# Patient Record
Sex: Female | Born: 1960 | Race: Black or African American | Hispanic: No | Marital: Single | Smoking: Never smoker
Health system: Southern US, Community
[De-identification: ages and names within clinical notes are randomized; demographics above are authoritative.]

---

## 2015-04-12 ENCOUNTER — Other Ambulatory Visit (HOSPITAL_COMMUNITY)
Admission: RE | Admit: 2015-04-12 | Discharge: 2015-04-12 | Disposition: A | Discharge status: Home / Self Care | Payer: Self-pay | Source: Ambulatory Visit | Attending: Family Medicine | Admitting: Family Medicine

## 2015-04-12 DIAGNOSIS — Z1151 Encounter for screening for human papillomavirus (HPV): Secondary | ICD-10-CM | POA: Insufficient documentation

## 2015-04-12 DIAGNOSIS — Z01411 Encounter for gynecological examination (general) (routine) with abnormal findings: Secondary | ICD-10-CM | POA: Insufficient documentation

## 2015-11-05 ENCOUNTER — Other Ambulatory Visit: Payer: Self-pay

## 2015-11-05 DIAGNOSIS — Z1231 Encounter for screening mammogram for malignant neoplasm of breast: Secondary | ICD-10-CM

## 2015-11-27 ENCOUNTER — Ambulatory Visit
Admission: RE | Admit: 2015-11-27 | Discharge: 2015-11-27 | Disposition: A | Discharge status: Home / Self Care | Payer: 59 | Source: Ambulatory Visit

## 2015-11-27 DIAGNOSIS — Z1231 Encounter for screening mammogram for malignant neoplasm of breast: Secondary | ICD-10-CM

## 2016-05-02 ENCOUNTER — Other Ambulatory Visit: Payer: Self-pay | Admitting: Family Medicine

## 2016-05-02 ENCOUNTER — Other Ambulatory Visit (HOSPITAL_COMMUNITY)
Admission: RE | Admit: 2016-05-02 | Discharge: 2016-05-02 | Disposition: A | Discharge status: Home / Self Care | Payer: 59 | Source: Ambulatory Visit | Attending: Family Medicine | Admitting: Family Medicine

## 2016-05-02 DIAGNOSIS — Z1151 Encounter for screening for human papillomavirus (HPV): Secondary | ICD-10-CM | POA: Insufficient documentation

## 2016-05-06 LAB — CYTOLOGY - PAP

## 2016-12-05 ENCOUNTER — Other Ambulatory Visit: Payer: Self-pay | Admitting: Family Medicine

## 2016-12-05 ENCOUNTER — Ambulatory Visit
Admission: RE | Admit: 2016-12-05 | Discharge: 2016-12-05 | Disposition: A | Discharge status: Home / Self Care | Payer: 59 | Source: Ambulatory Visit | Attending: Family Medicine | Admitting: Family Medicine

## 2016-12-05 DIAGNOSIS — Z1231 Encounter for screening mammogram for malignant neoplasm of breast: Secondary | ICD-10-CM | POA: Diagnosis not present

## 2017-01-20 DIAGNOSIS — L219 Seborrheic dermatitis, unspecified: Secondary | ICD-10-CM | POA: Diagnosis not present

## 2017-01-20 DIAGNOSIS — L309 Dermatitis, unspecified: Secondary | ICD-10-CM | POA: Diagnosis not present

## 2017-01-20 DIAGNOSIS — L400 Psoriasis vulgaris: Secondary | ICD-10-CM | POA: Diagnosis not present

## 2017-04-23 DIAGNOSIS — M2170 Unequal limb length (acquired), unspecified site: Secondary | ICD-10-CM | POA: Diagnosis not present

## 2017-04-23 DIAGNOSIS — M76821 Posterior tibial tendinitis, right leg: Secondary | ICD-10-CM | POA: Diagnosis not present

## 2017-04-23 DIAGNOSIS — M76822 Posterior tibial tendinitis, left leg: Secondary | ICD-10-CM | POA: Diagnosis not present

## 2017-04-30 DIAGNOSIS — M71572 Other bursitis, not elsewhere classified, left ankle and foot: Secondary | ICD-10-CM | POA: Diagnosis not present

## 2017-04-30 DIAGNOSIS — M722 Plantar fascial fibromatosis: Secondary | ICD-10-CM | POA: Diagnosis not present

## 2017-08-03 DIAGNOSIS — M722 Plantar fascial fibromatosis: Secondary | ICD-10-CM | POA: Diagnosis not present

## 2017-08-03 DIAGNOSIS — M71571 Other bursitis, not elsewhere classified, right ankle and foot: Secondary | ICD-10-CM | POA: Diagnosis not present

## 2017-09-18 DIAGNOSIS — M71571 Other bursitis, not elsewhere classified, right ankle and foot: Secondary | ICD-10-CM | POA: Diagnosis not present

## 2017-09-18 DIAGNOSIS — M722 Plantar fascial fibromatosis: Secondary | ICD-10-CM | POA: Diagnosis not present

## 2017-09-28 DIAGNOSIS — L309 Dermatitis, unspecified: Secondary | ICD-10-CM | POA: Diagnosis not present

## 2017-09-28 DIAGNOSIS — L409 Psoriasis, unspecified: Secondary | ICD-10-CM | POA: Diagnosis not present

## 2017-10-05 DIAGNOSIS — L408 Other psoriasis: Secondary | ICD-10-CM | POA: Diagnosis not present

## 2018-02-02 DIAGNOSIS — M25532 Pain in left wrist: Secondary | ICD-10-CM | POA: Diagnosis not present

## 2018-02-02 DIAGNOSIS — M25531 Pain in right wrist: Secondary | ICD-10-CM | POA: Diagnosis not present

## 2018-02-15 DIAGNOSIS — R200 Anesthesia of skin: Secondary | ICD-10-CM | POA: Diagnosis not present

## 2018-03-03 DIAGNOSIS — G5601 Carpal tunnel syndrome, right upper limb: Secondary | ICD-10-CM | POA: Diagnosis not present

## 2018-03-03 DIAGNOSIS — G5602 Carpal tunnel syndrome, left upper limb: Secondary | ICD-10-CM | POA: Diagnosis not present

## 2018-03-15 DIAGNOSIS — G5601 Carpal tunnel syndrome, right upper limb: Secondary | ICD-10-CM | POA: Diagnosis not present

## 2018-03-15 DIAGNOSIS — G5602 Carpal tunnel syndrome, left upper limb: Secondary | ICD-10-CM | POA: Diagnosis not present

## 2018-03-30 DIAGNOSIS — G5602 Carpal tunnel syndrome, left upper limb: Secondary | ICD-10-CM | POA: Diagnosis not present

## 2018-04-02 DIAGNOSIS — G5602 Carpal tunnel syndrome, left upper limb: Secondary | ICD-10-CM | POA: Diagnosis not present

## 2018-04-13 DIAGNOSIS — G5602 Carpal tunnel syndrome, left upper limb: Secondary | ICD-10-CM | POA: Diagnosis not present

## 2018-04-14 DIAGNOSIS — G5602 Carpal tunnel syndrome, left upper limb: Secondary | ICD-10-CM | POA: Diagnosis not present

## 2018-04-14 DIAGNOSIS — M65341 Trigger finger, right ring finger: Secondary | ICD-10-CM | POA: Diagnosis not present

## 2018-05-26 DIAGNOSIS — M65341 Trigger finger, right ring finger: Secondary | ICD-10-CM | POA: Diagnosis not present

## 2018-05-26 DIAGNOSIS — G5602 Carpal tunnel syndrome, left upper limb: Secondary | ICD-10-CM | POA: Diagnosis not present

## 2018-05-26 DIAGNOSIS — G5601 Carpal tunnel syndrome, right upper limb: Secondary | ICD-10-CM | POA: Diagnosis not present

## 2018-05-27 DIAGNOSIS — S93601A Unspecified sprain of right foot, initial encounter: Secondary | ICD-10-CM | POA: Diagnosis not present

## 2018-05-27 DIAGNOSIS — M79671 Pain in right foot: Secondary | ICD-10-CM | POA: Diagnosis not present

## 2018-05-27 DIAGNOSIS — M79672 Pain in left foot: Secondary | ICD-10-CM | POA: Diagnosis not present

## 2018-05-27 DIAGNOSIS — M722 Plantar fascial fibromatosis: Secondary | ICD-10-CM | POA: Diagnosis not present

## 2018-06-01 DIAGNOSIS — M79672 Pain in left foot: Secondary | ICD-10-CM | POA: Diagnosis not present

## 2018-06-01 DIAGNOSIS — M722 Plantar fascial fibromatosis: Secondary | ICD-10-CM | POA: Diagnosis not present

## 2018-06-01 DIAGNOSIS — M79671 Pain in right foot: Secondary | ICD-10-CM | POA: Diagnosis not present

## 2018-06-07 DIAGNOSIS — M79672 Pain in left foot: Secondary | ICD-10-CM | POA: Diagnosis not present

## 2018-06-07 DIAGNOSIS — M722 Plantar fascial fibromatosis: Secondary | ICD-10-CM | POA: Diagnosis not present

## 2018-06-07 DIAGNOSIS — M79671 Pain in right foot: Secondary | ICD-10-CM | POA: Diagnosis not present

## 2018-06-09 DIAGNOSIS — M722 Plantar fascial fibromatosis: Secondary | ICD-10-CM | POA: Diagnosis not present

## 2018-06-09 DIAGNOSIS — M79671 Pain in right foot: Secondary | ICD-10-CM | POA: Diagnosis not present

## 2018-06-09 DIAGNOSIS — M79672 Pain in left foot: Secondary | ICD-10-CM | POA: Diagnosis not present

## 2018-06-14 DIAGNOSIS — M79672 Pain in left foot: Secondary | ICD-10-CM | POA: Diagnosis not present

## 2018-06-14 DIAGNOSIS — M79671 Pain in right foot: Secondary | ICD-10-CM | POA: Diagnosis not present

## 2018-06-14 DIAGNOSIS — M722 Plantar fascial fibromatosis: Secondary | ICD-10-CM | POA: Diagnosis not present

## 2018-06-16 DIAGNOSIS — M79672 Pain in left foot: Secondary | ICD-10-CM | POA: Diagnosis not present

## 2018-06-16 DIAGNOSIS — M79671 Pain in right foot: Secondary | ICD-10-CM | POA: Diagnosis not present

## 2018-06-16 DIAGNOSIS — M722 Plantar fascial fibromatosis: Secondary | ICD-10-CM | POA: Diagnosis not present

## 2018-06-21 DIAGNOSIS — M79672 Pain in left foot: Secondary | ICD-10-CM | POA: Diagnosis not present

## 2018-06-21 DIAGNOSIS — M722 Plantar fascial fibromatosis: Secondary | ICD-10-CM | POA: Diagnosis not present

## 2018-06-21 DIAGNOSIS — M79671 Pain in right foot: Secondary | ICD-10-CM | POA: Diagnosis not present

## 2018-06-23 DIAGNOSIS — M722 Plantar fascial fibromatosis: Secondary | ICD-10-CM | POA: Diagnosis not present

## 2018-06-23 DIAGNOSIS — M79672 Pain in left foot: Secondary | ICD-10-CM | POA: Diagnosis not present

## 2018-06-23 DIAGNOSIS — M79671 Pain in right foot: Secondary | ICD-10-CM | POA: Diagnosis not present

## 2018-06-28 DIAGNOSIS — M79671 Pain in right foot: Secondary | ICD-10-CM | POA: Diagnosis not present

## 2018-06-28 DIAGNOSIS — M79672 Pain in left foot: Secondary | ICD-10-CM | POA: Diagnosis not present

## 2018-06-28 DIAGNOSIS — M722 Plantar fascial fibromatosis: Secondary | ICD-10-CM | POA: Diagnosis not present

## 2018-06-30 DIAGNOSIS — M722 Plantar fascial fibromatosis: Secondary | ICD-10-CM | POA: Diagnosis not present

## 2018-06-30 DIAGNOSIS — M79672 Pain in left foot: Secondary | ICD-10-CM | POA: Diagnosis not present

## 2018-06-30 DIAGNOSIS — M79671 Pain in right foot: Secondary | ICD-10-CM | POA: Diagnosis not present

## 2018-08-24 DIAGNOSIS — J069 Acute upper respiratory infection, unspecified: Secondary | ICD-10-CM | POA: Diagnosis not present

## 2018-08-24 DIAGNOSIS — J209 Acute bronchitis, unspecified: Secondary | ICD-10-CM | POA: Diagnosis not present

## 2018-08-31 ENCOUNTER — Other Ambulatory Visit: Payer: Self-pay | Admitting: Family Medicine

## 2018-08-31 ENCOUNTER — Other Ambulatory Visit (HOSPITAL_COMMUNITY)
Admission: RE | Admit: 2018-08-31 | Discharge: 2018-08-31 | Disposition: A | Discharge status: Home / Self Care | Payer: 59 | Source: Ambulatory Visit | Attending: Family Medicine | Admitting: Family Medicine

## 2018-08-31 DIAGNOSIS — Z23 Encounter for immunization: Secondary | ICD-10-CM | POA: Diagnosis not present

## 2018-08-31 DIAGNOSIS — Z79899 Other long term (current) drug therapy: Secondary | ICD-10-CM | POA: Diagnosis not present

## 2018-08-31 DIAGNOSIS — Z0000 Encounter for general adult medical examination without abnormal findings: Secondary | ICD-10-CM | POA: Diagnosis not present

## 2018-08-31 DIAGNOSIS — Z01411 Encounter for gynecological examination (general) (routine) with abnormal findings: Secondary | ICD-10-CM | POA: Insufficient documentation

## 2018-09-02 LAB — CYTOLOGY - PAP
DIAGNOSIS: NEGATIVE
HPV (WINDOPATH): NOT DETECTED

## 2018-09-21 DIAGNOSIS — M65341 Trigger finger, right ring finger: Secondary | ICD-10-CM | POA: Diagnosis not present

## 2018-09-21 DIAGNOSIS — G5601 Carpal tunnel syndrome, right upper limb: Secondary | ICD-10-CM | POA: Diagnosis not present

## 2018-09-24 DIAGNOSIS — G5601 Carpal tunnel syndrome, right upper limb: Secondary | ICD-10-CM | POA: Diagnosis not present

## 2018-09-24 DIAGNOSIS — M65341 Trigger finger, right ring finger: Secondary | ICD-10-CM | POA: Diagnosis not present

## 2018-10-06 DIAGNOSIS — L309 Dermatitis, unspecified: Secondary | ICD-10-CM | POA: Diagnosis not present

## 2018-10-06 DIAGNOSIS — M792 Neuralgia and neuritis, unspecified: Secondary | ICD-10-CM | POA: Diagnosis not present

## 2018-10-09 DIAGNOSIS — Z23 Encounter for immunization: Secondary | ICD-10-CM | POA: Diagnosis not present

## 2018-12-08 DIAGNOSIS — Z23 Encounter for immunization: Secondary | ICD-10-CM | POA: Diagnosis not present

## 2019-01-17 ENCOUNTER — Other Ambulatory Visit: Payer: Self-pay | Admitting: Family Medicine

## 2019-01-17 DIAGNOSIS — Z1231 Encounter for screening mammogram for malignant neoplasm of breast: Secondary | ICD-10-CM

## 2019-02-09 ENCOUNTER — Ambulatory Visit: Payer: 59

## 2019-05-25 ENCOUNTER — Ambulatory Visit
Admission: RE | Admit: 2019-05-25 | Discharge: 2019-05-25 | Disposition: A | Discharge status: Home / Self Care | Payer: 59 | Source: Ambulatory Visit | Attending: Family Medicine | Admitting: Family Medicine

## 2019-05-25 ENCOUNTER — Other Ambulatory Visit: Payer: Self-pay

## 2019-05-25 DIAGNOSIS — Z1231 Encounter for screening mammogram for malignant neoplasm of breast: Secondary | ICD-10-CM

## 2019-05-26 ENCOUNTER — Other Ambulatory Visit: Payer: Self-pay | Admitting: Family Medicine

## 2019-05-26 DIAGNOSIS — R928 Other abnormal and inconclusive findings on diagnostic imaging of breast: Secondary | ICD-10-CM

## 2019-05-30 ENCOUNTER — Other Ambulatory Visit: Payer: Self-pay

## 2019-05-30 ENCOUNTER — Ambulatory Visit: Payer: 59

## 2019-05-30 ENCOUNTER — Ambulatory Visit
Admission: RE | Admit: 2019-05-30 | Discharge: 2019-05-30 | Disposition: A | Discharge status: Home / Self Care | Payer: 59 | Source: Ambulatory Visit | Attending: Family Medicine | Admitting: Family Medicine

## 2019-05-30 DIAGNOSIS — R928 Other abnormal and inconclusive findings on diagnostic imaging of breast: Secondary | ICD-10-CM

## 2020-07-24 ENCOUNTER — Other Ambulatory Visit: Payer: Self-pay | Admitting: Family Medicine

## 2020-07-24 DIAGNOSIS — Z1231 Encounter for screening mammogram for malignant neoplasm of breast: Secondary | ICD-10-CM

## 2020-08-09 ENCOUNTER — Other Ambulatory Visit: Payer: Self-pay

## 2020-08-09 ENCOUNTER — Ambulatory Visit
Admission: RE | Admit: 2020-08-09 | Discharge: 2020-08-09 | Disposition: A | Discharge status: Home / Self Care | Payer: Commercial Managed Care - PPO | Source: Ambulatory Visit | Attending: Family Medicine | Admitting: Family Medicine

## 2020-08-09 DIAGNOSIS — Z1231 Encounter for screening mammogram for malignant neoplasm of breast: Secondary | ICD-10-CM

## 2021-07-29 ENCOUNTER — Other Ambulatory Visit: Payer: Self-pay | Admitting: Family Medicine

## 2021-07-29 DIAGNOSIS — Z1231 Encounter for screening mammogram for malignant neoplasm of breast: Secondary | ICD-10-CM

## 2021-09-02 ENCOUNTER — Ambulatory Visit
Admission: RE | Admit: 2021-09-02 | Discharge: 2021-09-02 | Disposition: A | Discharge status: Home / Self Care | Payer: Managed Care, Other (non HMO) | Source: Ambulatory Visit | Attending: Family Medicine | Admitting: Family Medicine

## 2021-09-02 ENCOUNTER — Other Ambulatory Visit: Payer: Self-pay

## 2021-09-02 DIAGNOSIS — Z1231 Encounter for screening mammogram for malignant neoplasm of breast: Secondary | ICD-10-CM

## 2022-05-10 IMAGING — MG DIGITAL SCREENING BILAT W/ TOMO W/ CAD
8 series · 8 of 24 positions shown · non-contrast
Comparison: Previous exam(s).

CLINICAL DATA: Screening.

EXAM:
DIGITAL SCREENING BILATERAL MAMMOGRAM WITH TOMO AND CAD

[R CC synth-2D]
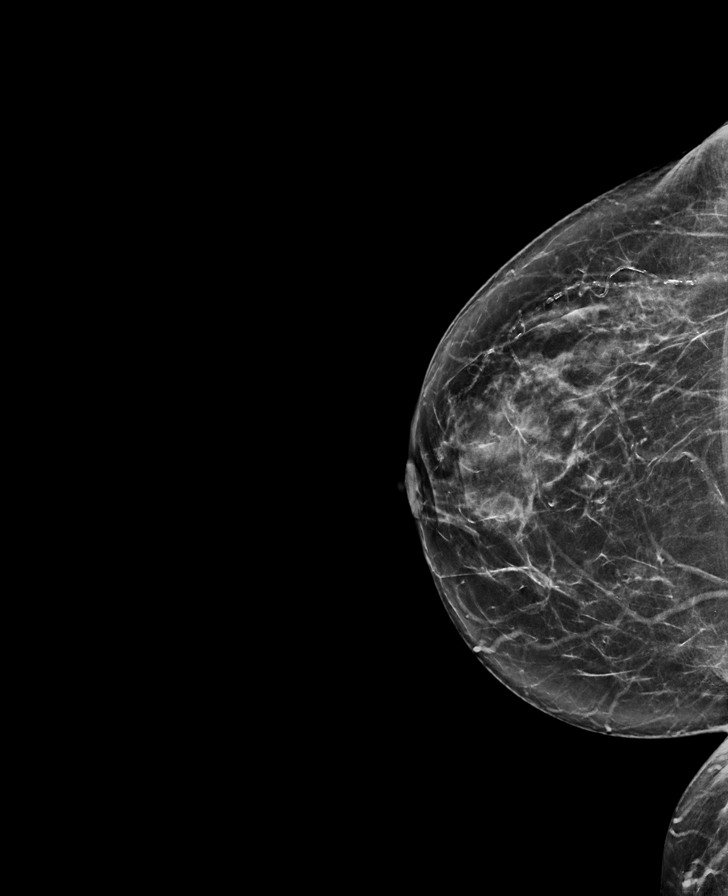

[L MLO synth-2D]
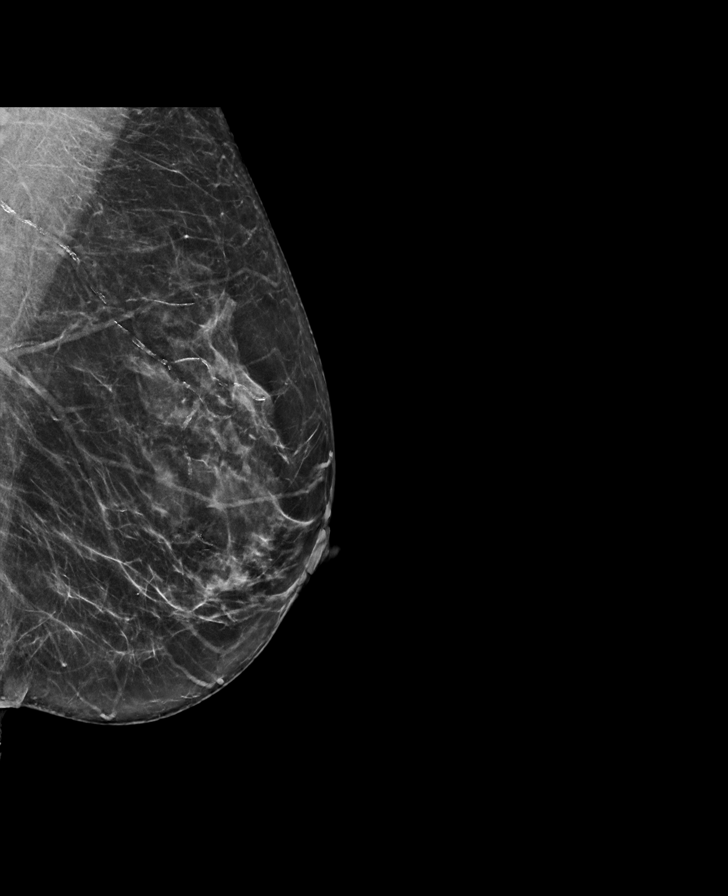

[R MLO synth-2D]
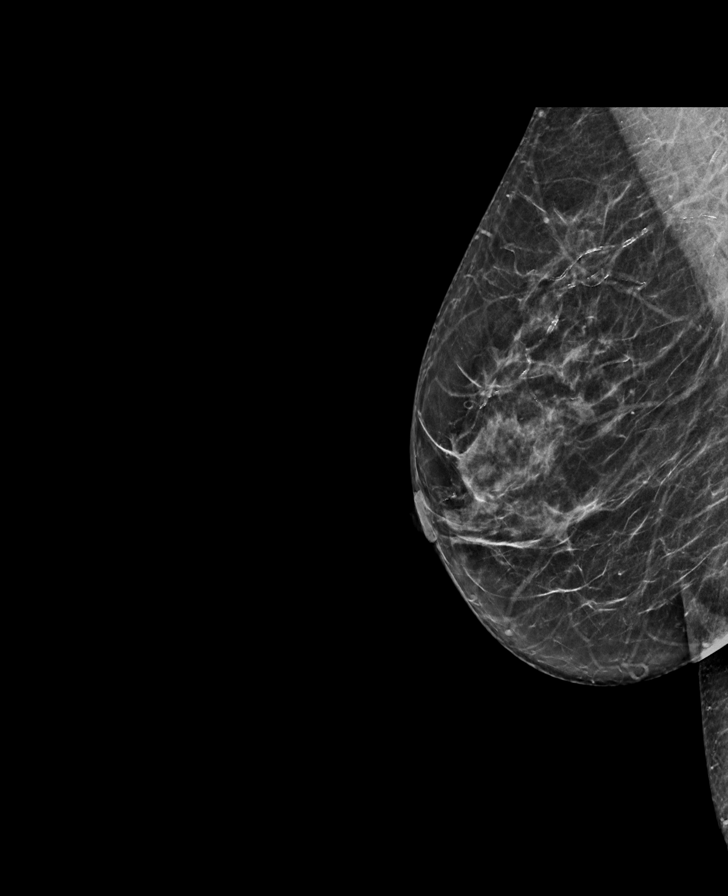

[L CC synth-2D]
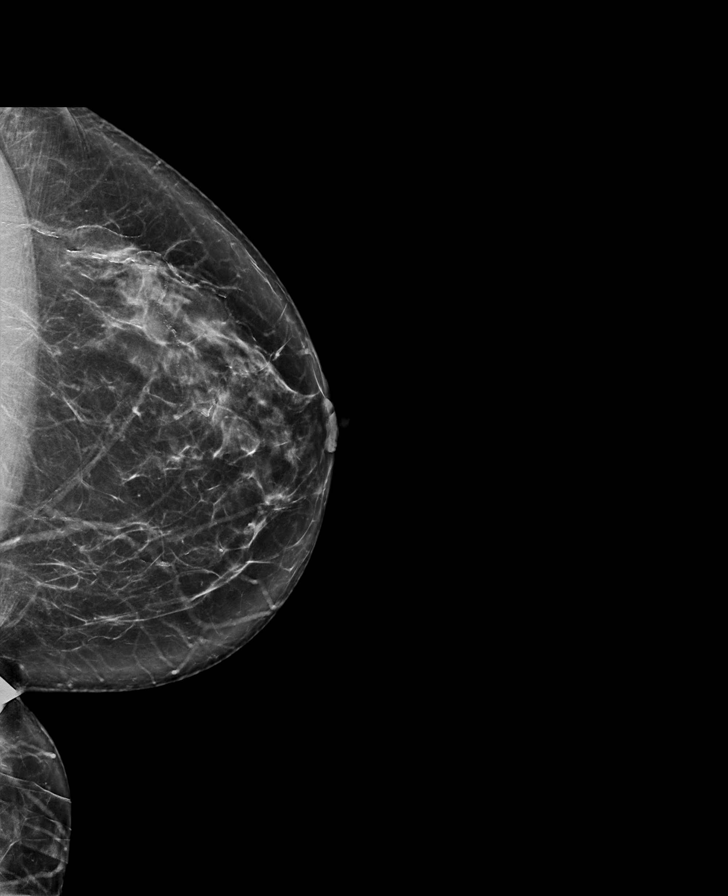

[L CC tomo · tomo slice 37/74.0]
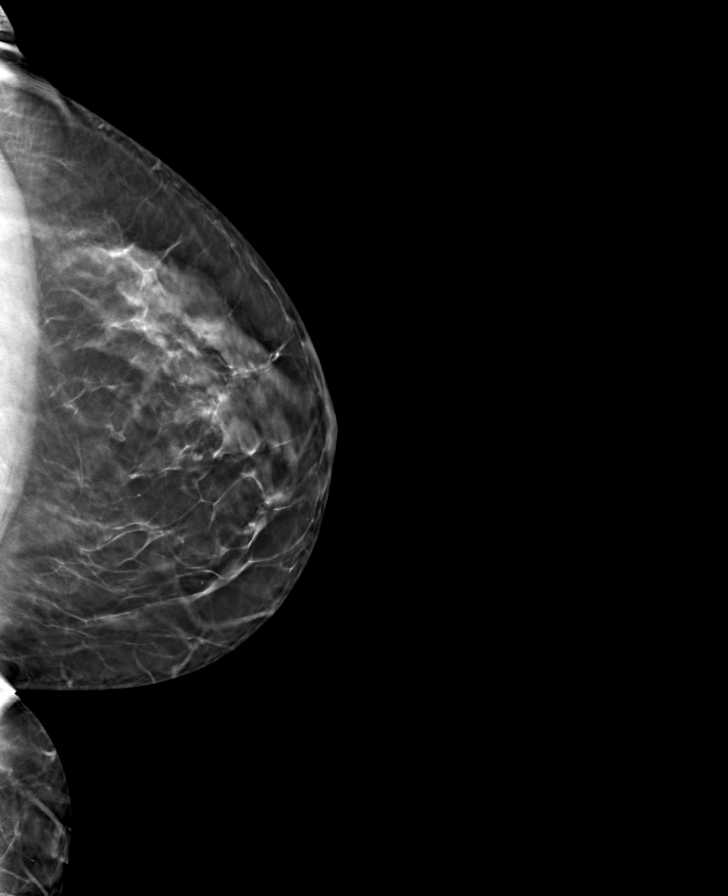

[R CC tomo · tomo slice 33/66.0]
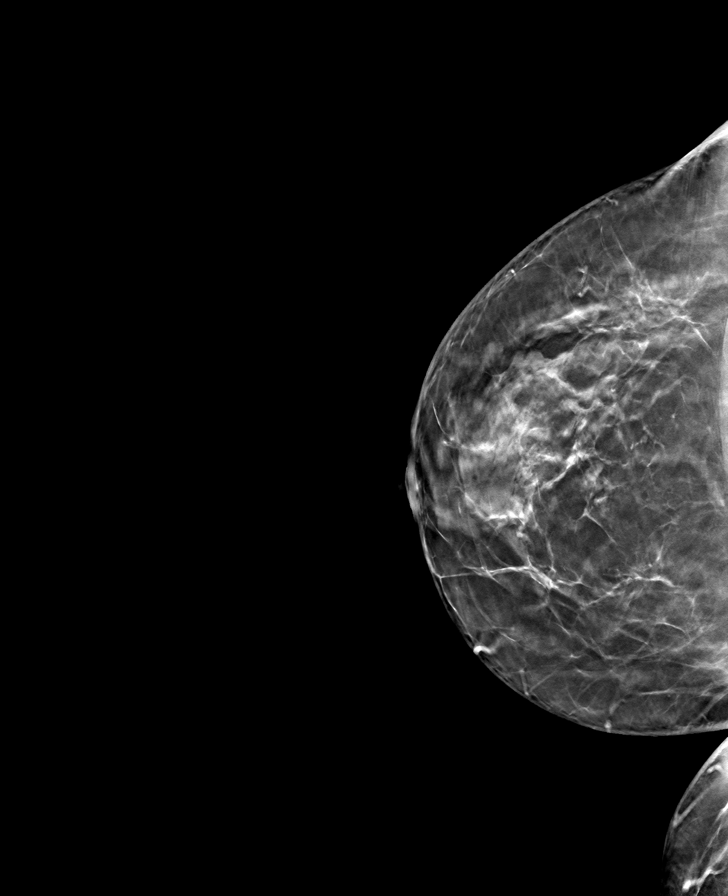

[L MLO tomo · tomo slice 34/67.0]
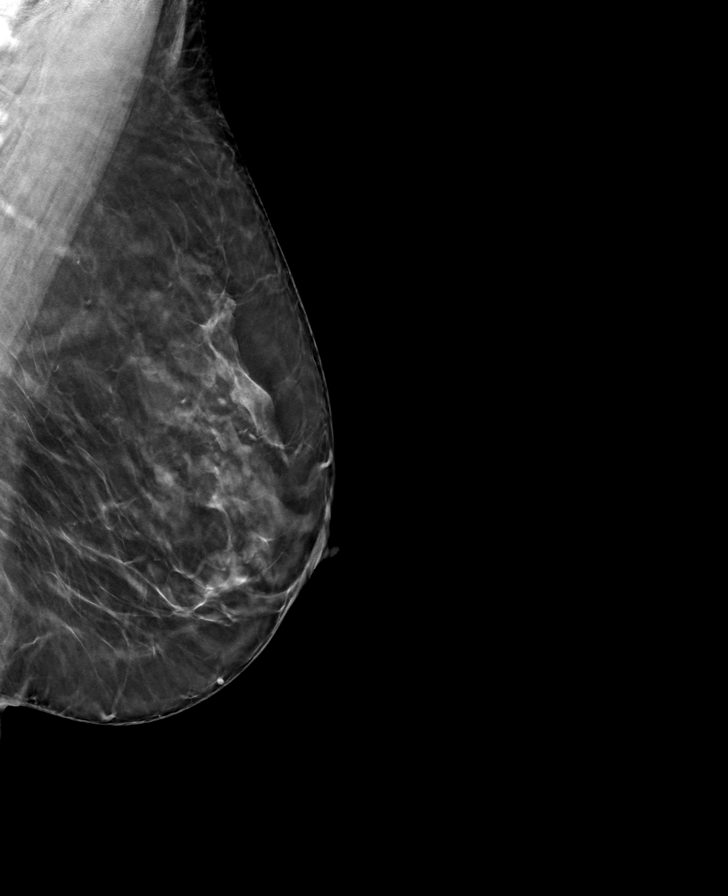

[R MLO tomo · tomo slice 33/64.0]
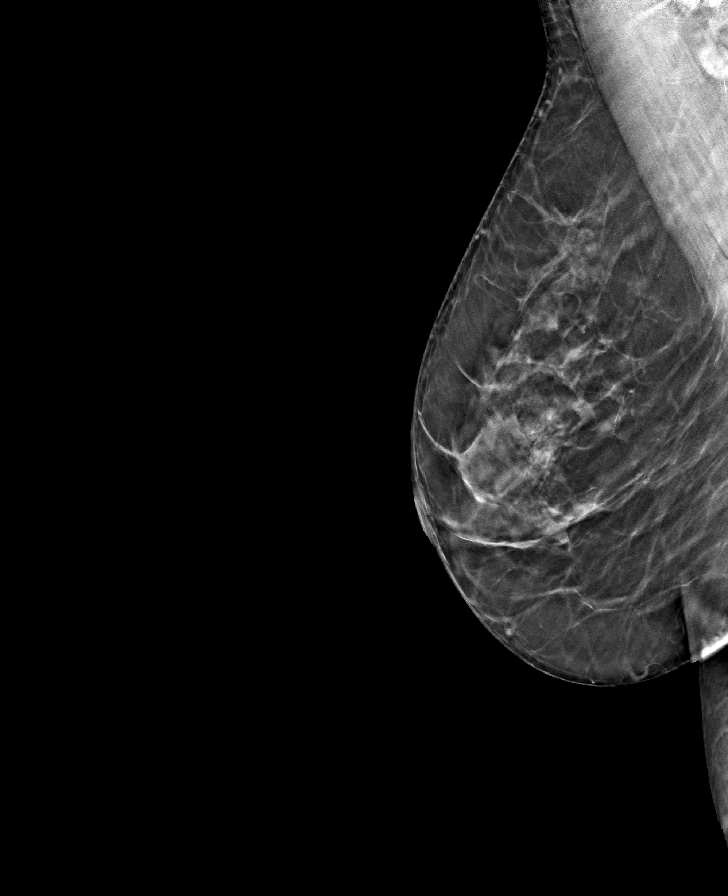

[8 of 24 positions shown; findings below may reference images not displayed]

ACR Breast Density Category b: There are scattered areas of
fibroglandular density.
FINDINGS: There are no findings suspicious for malignancy. Images were
processed with CAD.
IMPRESSION: No mammographic evidence of malignancy. A result letter of this
screening mammogram will be mailed directly to the patient.

RECOMMENDATION:
Screening mammogram in one year. (Code:[id number])

BI-RADS CATEGORY  1: Negative.

## 2022-08-18 ENCOUNTER — Other Ambulatory Visit: Payer: Self-pay | Admitting: Family Medicine

## 2022-08-18 DIAGNOSIS — Z1231 Encounter for screening mammogram for malignant neoplasm of breast: Secondary | ICD-10-CM

## 2022-09-03 ENCOUNTER — Ambulatory Visit
Admission: RE | Admit: 2022-09-03 | Discharge: 2022-09-03 | Disposition: A | Discharge status: Home / Self Care | Payer: Commercial Managed Care - HMO | Source: Ambulatory Visit | Attending: Family Medicine | Admitting: Family Medicine

## 2022-09-03 DIAGNOSIS — Z1231 Encounter for screening mammogram for malignant neoplasm of breast: Secondary | ICD-10-CM

## 2023-01-15 ENCOUNTER — Encounter (HOSPITAL_COMMUNITY): Payer: Self-pay

## 2023-01-15 ENCOUNTER — Ambulatory Visit (HOSPITAL_COMMUNITY)
Admission: EM | Admit: 2023-01-15 | Discharge: 2023-01-15 | Disposition: A | Discharge status: Home / Self Care | Payer: Commercial Managed Care - HMO | Attending: Family Medicine | Admitting: Family Medicine

## 2023-01-15 DIAGNOSIS — J069 Acute upper respiratory infection, unspecified: Secondary | ICD-10-CM

## 2023-01-15 DIAGNOSIS — J111 Influenza due to unidentified influenza virus with other respiratory manifestations: Secondary | ICD-10-CM

## 2023-01-15 MED ORDER — HYDROCODONE BIT-HOMATROP MBR 5-1.5 MG/5ML PO SOLN: 5.0000 mL | Freq: Four times a day (QID) | ORAL | 0 refills | Status: AC | PRN

## 2023-01-15 NOTE — ED Triage Notes (Signed)
Pt presents with complaints of cough and flu like symptoms since Sunday.

## 2023-01-15 NOTE — ED Provider Notes (Signed)
  [facility]   [id number] 01/15/23 Arrival Time: [id number]  ASSESSMENT & PLAN:  1. Viral URI with cough   2. Influenza-like illness    Suspect influenza. Discussed typical duration of likely viral illness. Work note provided. OTC symptom care as needed.  Discharge Medication List as of 01/15/2023  3:35 PM     START taking these medications   Details  HYDROcodone bit-homatropine (HYCODAN) 5-1.5 MG/5ML syrup Take 5 mLs by mouth every 6 (six) hours as needed for cough., Starting Thu 01/15/2023, Normal         Follow-up Information     London Pepper, MD.   Specialty: Family Medicine Why: As needed. Contact information: [place] [id number] [facility].   Specialty: Urgent Care Why: If worsening or failing to improve as anticipated. Contact information: [place] [id number] [phone]                Reviewed expectations re: course of current medical issues. Questions answered. Outlined signs and symptoms indicating need for more acute intervention. Understanding verbalized. After Visit Summary given.   SUBJECTIVE: History from: Patient. Lindsey Russo is a 62 y.o. female. Pt presents with complaints of cough and flu like symptoms; abrupt onset; x 4 days. Denies: difficulty breathing. Questions subj fever. Normal PO intake without n/v/d.  OBJECTIVE:  Vitals:   01/15/23 1507  BP: 103/74  Pulse: 90  Resp: 18  Temp: 99.2 F (37.3 C)  SpO2: 96%    General appearance: alert; no distress Eyes: PERRLA; EOMI; conjunctiva normal HENT: [place]; AT; with nasal congestion Neck: supple  Lungs: speaks full sentences without difficulty; unlabored; CTAB Extremities: no edema Skin: warm and dry Neurologic: normal gait Psychological: alert and cooperative; normal mood and affect   No Known Allergies  History reviewed. No pertinent past  medical history. Social History   Socioeconomic History   Marital status: Single    Spouse name: Not on file   Number of children: Not on file   Years of education: Not on file   Highest education level: Not on file  Occupational History   Not on file  Tobacco Use   Smoking status: Never   Smokeless tobacco: Never  Substance and Sexual Activity   Alcohol use: Not on file   Drug use: Not on file   Sexual activity: Not on file  Other Topics Concern   Not on file  Social History Narrative   Not on file   Social Determinants of Health   Financial Resource Strain: Not on file  Food Insecurity: Not on file  Transportation Needs: Not on file  Physical Activity: Not on file  Stress: Not on file  Social Connections: Not on file  Intimate Partner Violence: Not on file   Family History  Problem Relation Age of Onset   Cancer Mother    Cancer Father    History reviewed. No pertinent surgical history.   Vanessa Kick, MD 01/15/23 1550

## 2023-01-15 NOTE — Discharge Instructions (Signed)

## 2023-08-28 ENCOUNTER — Other Ambulatory Visit: Payer: Self-pay | Admitting: Family Medicine

## 2023-08-28 DIAGNOSIS — Z1231 Encounter for screening mammogram for malignant neoplasm of breast: Secondary | ICD-10-CM

## 2023-09-10 ENCOUNTER — Ambulatory Visit
Admission: RE | Admit: 2023-09-10 | Discharge: 2023-09-10 | Disposition: A | Discharge status: Home / Self Care | Payer: Self-pay | Source: Ambulatory Visit | Attending: Family Medicine | Admitting: Family Medicine

## 2023-09-10 DIAGNOSIS — Z1231 Encounter for screening mammogram for malignant neoplasm of breast: Secondary | ICD-10-CM
